# Patient Record
Sex: Male | Born: 1960
Health system: Southern US, Community
[De-identification: ages and names within clinical notes are randomized; demographics above are authoritative.]

## PROBLEM LIST (undated history)

## (undated) DIAGNOSIS — Z789 Other specified health status: Secondary | ICD-10-CM

## (undated) HISTORY — PX: WISDOM TOOTH EXTRACTION: SHX21

---

## 2001-01-11 ENCOUNTER — Ambulatory Visit (HOSPITAL_COMMUNITY): Admission: RE | Admit: 2001-01-11 | Discharge: 2001-01-11 | Payer: Self-pay | Admitting: Urology

## 2001-01-11 ENCOUNTER — Encounter: Payer: Self-pay | Admitting: Urology

## 2011-08-21 ENCOUNTER — Other Ambulatory Visit (HOSPITAL_COMMUNITY): Payer: Self-pay | Admitting: Internal Medicine

## 2011-08-21 ENCOUNTER — Ambulatory Visit (HOSPITAL_COMMUNITY)
Admission: RE | Admit: 2011-08-21 | Discharge: 2011-08-21 | Disposition: A | Discharge status: Home / Self Care | Payer: 59 | Source: Ambulatory Visit | Attending: Internal Medicine | Admitting: Internal Medicine

## 2011-08-21 DIAGNOSIS — M546 Pain in thoracic spine: Secondary | ICD-10-CM | POA: Insufficient documentation

## 2011-08-21 DIAGNOSIS — R05 Cough: Secondary | ICD-10-CM | POA: Insufficient documentation

## 2011-08-21 DIAGNOSIS — M549 Dorsalgia, unspecified: Secondary | ICD-10-CM

## 2011-08-21 DIAGNOSIS — R059 Cough, unspecified: Secondary | ICD-10-CM | POA: Insufficient documentation

## 2012-02-12 IMAGING — CR DG CHEST 2V
3 series · 3 of 3 positions shown · non-contrast
Comparison: None

CLINICAL DATA: Cough, mid back pain, radiculopathy radiating around
chest

CHEST - 2 VIEW

[view not recorded (1 of 3)]
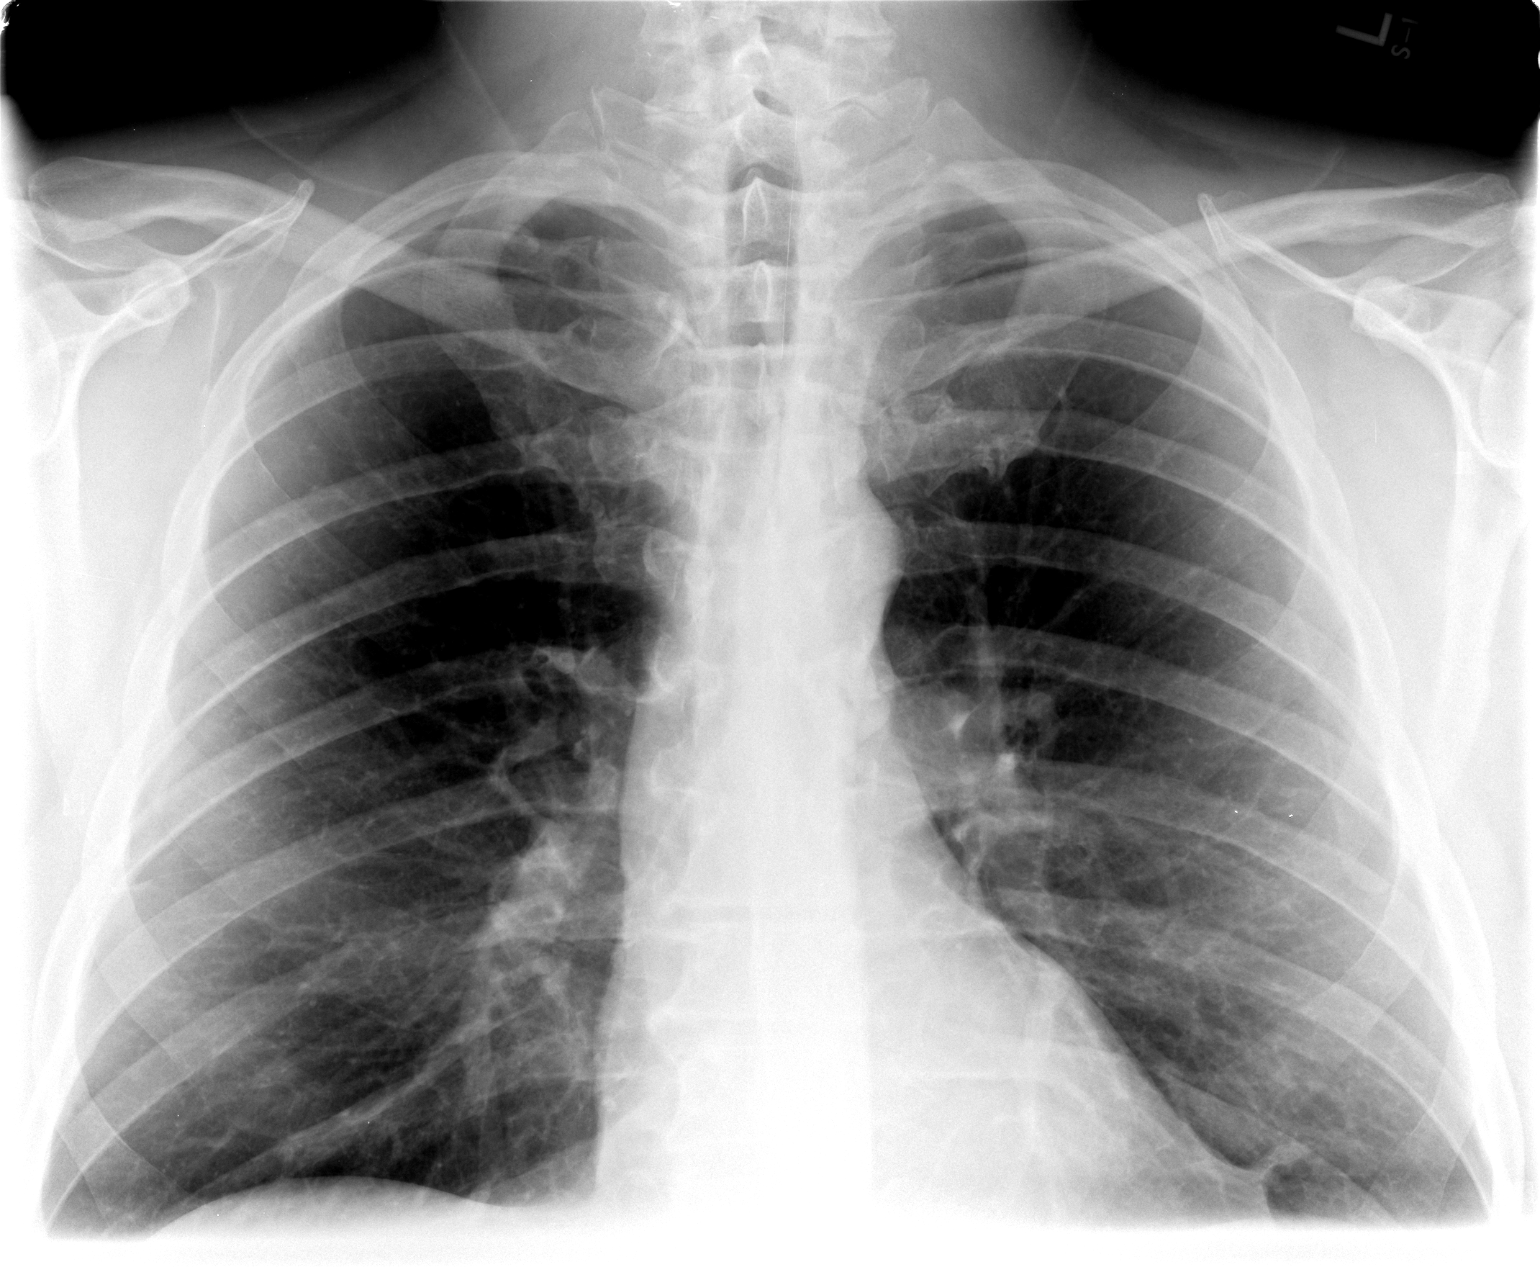

[view not recorded (2 of 3)]
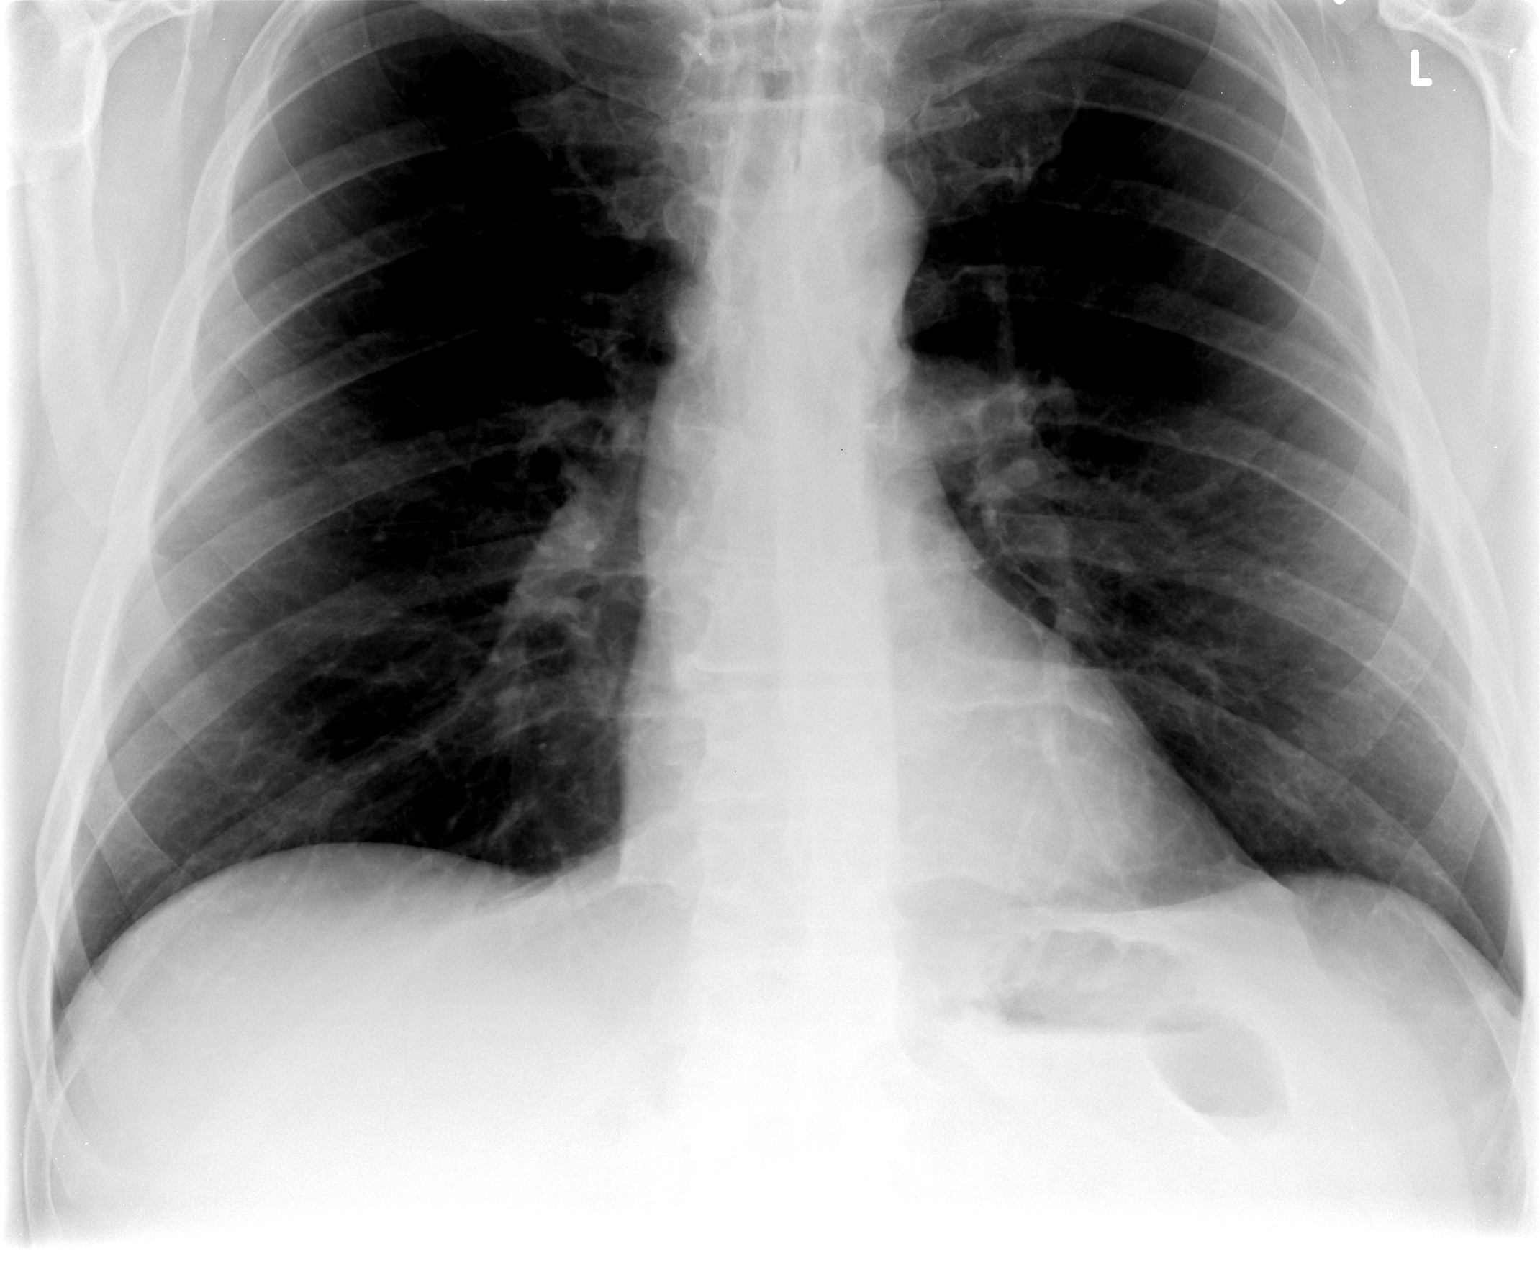

[view not recorded (3 of 3)]
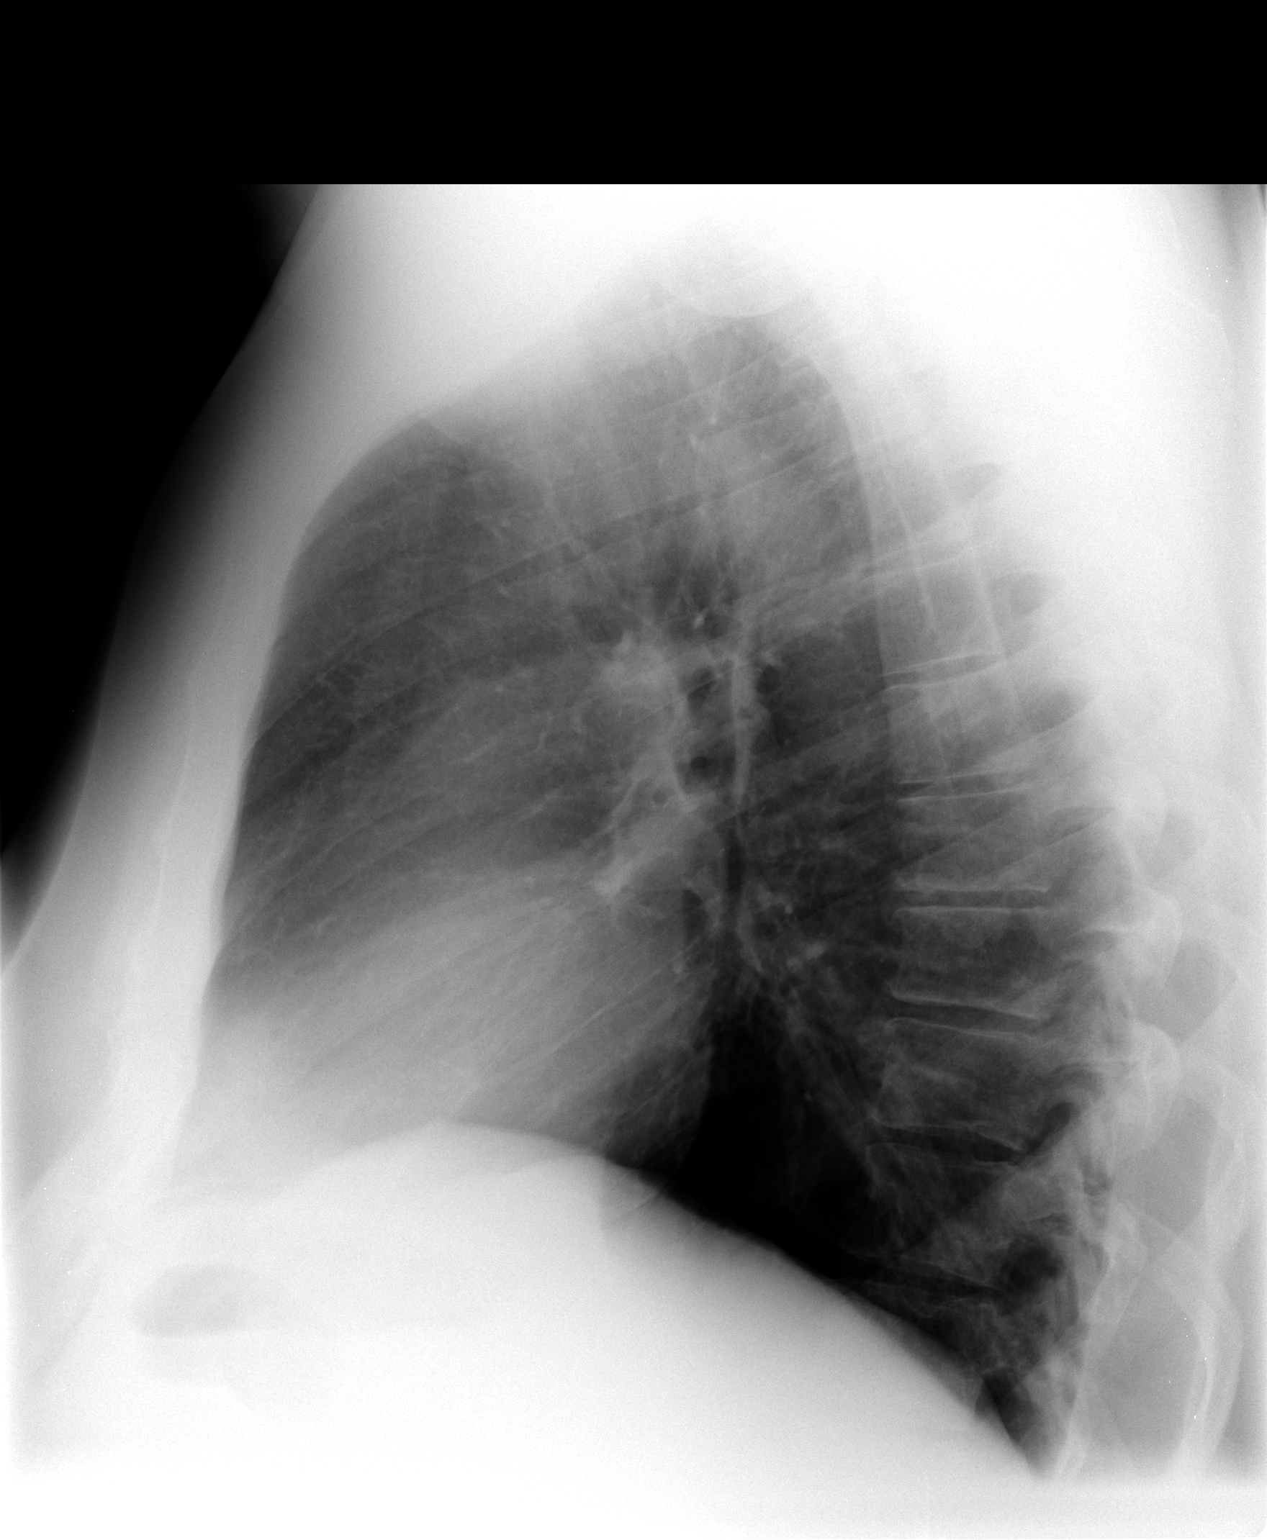

[3 of 3 positions shown; findings below may reference images not displayed]

FINDINGS: Normal heart size, mediastinal contours, and pulmonary vascularity.
Lungs clear.
No pleural effusion or pneumothorax.
Bones unremarkable.
IMPRESSION: No acute abnormalities.

## 2017-05-27 DIAGNOSIS — L237 Allergic contact dermatitis due to plants, except food: Secondary | ICD-10-CM | POA: Diagnosis not present

## 2017-05-27 DIAGNOSIS — Z6835 Body mass index (BMI) 35.0-35.9, adult: Secondary | ICD-10-CM | POA: Diagnosis not present

## 2017-06-11 DIAGNOSIS — Z125 Encounter for screening for malignant neoplasm of prostate: Secondary | ICD-10-CM | POA: Diagnosis not present

## 2017-06-11 DIAGNOSIS — I10 Essential (primary) hypertension: Secondary | ICD-10-CM | POA: Diagnosis not present

## 2017-06-15 DIAGNOSIS — R030 Elevated blood-pressure reading, without diagnosis of hypertension: Secondary | ICD-10-CM | POA: Diagnosis not present

## 2017-06-15 DIAGNOSIS — E782 Mixed hyperlipidemia: Secondary | ICD-10-CM | POA: Diagnosis not present

## 2017-06-15 DIAGNOSIS — Z0000 Encounter for general adult medical examination without abnormal findings: Secondary | ICD-10-CM | POA: Diagnosis not present

## 2017-06-15 DIAGNOSIS — Z6835 Body mass index (BMI) 35.0-35.9, adult: Secondary | ICD-10-CM | POA: Diagnosis not present

## 2017-06-23 ENCOUNTER — Encounter (INDEPENDENT_AMBULATORY_CARE_PROVIDER_SITE_OTHER): Payer: Self-pay | Admitting: *Deleted

## 2017-07-07 DIAGNOSIS — Z62820 Parent-biological child conflict: Secondary | ICD-10-CM | POA: Diagnosis not present

## 2017-07-09 ENCOUNTER — Other Ambulatory Visit (INDEPENDENT_AMBULATORY_CARE_PROVIDER_SITE_OTHER): Payer: Self-pay | Admitting: *Deleted

## 2017-07-09 ENCOUNTER — Encounter (INDEPENDENT_AMBULATORY_CARE_PROVIDER_SITE_OTHER): Payer: Self-pay | Admitting: *Deleted

## 2017-07-09 DIAGNOSIS — Z1211 Encounter for screening for malignant neoplasm of colon: Secondary | ICD-10-CM

## 2017-08-03 DIAGNOSIS — Z62820 Parent-biological child conflict: Secondary | ICD-10-CM | POA: Diagnosis not present

## 2017-09-14 DIAGNOSIS — Z62820 Parent-biological child conflict: Secondary | ICD-10-CM | POA: Diagnosis not present

## 2017-09-29 ENCOUNTER — Encounter (INDEPENDENT_AMBULATORY_CARE_PROVIDER_SITE_OTHER): Payer: Self-pay | Admitting: *Deleted

## 2017-09-29 ENCOUNTER — Telehealth (INDEPENDENT_AMBULATORY_CARE_PROVIDER_SITE_OTHER): Payer: Self-pay | Admitting: *Deleted

## 2017-09-29 NOTE — Telephone Encounter (Signed)
Patient needs trilyte 

## 2017-09-30 MED ORDER — PEG 3350-KCL-NA BICARB-NACL 420 G PO SOLR: 4000.0000 mL | Freq: Once | ORAL | 0 refills | Status: AC

## 2017-10-20 DIAGNOSIS — Z62820 Parent-biological child conflict: Secondary | ICD-10-CM | POA: Diagnosis not present

## 2017-10-23 ENCOUNTER — Telehealth (INDEPENDENT_AMBULATORY_CARE_PROVIDER_SITE_OTHER): Payer: Self-pay | Admitting: *Deleted

## 2017-10-23 NOTE — Telephone Encounter (Signed)
Referring MD/PCP: hall   Procedure: tcs  Reason/Indication:  screening  Has patient had this procedure before?  no  If so, when, by whom and where?    Is there a family history of colon cancer?  no  Who?  What age when diagnosed?    Is patient diabetic?   no      Does patient have prosthetic heart valve or mechanical valve?  no  Do you have a pacemaker?  no  Has patient ever had endocarditis? no  Has patient had joint replacement within last 12 months?  no  Is patient constipated or take laxatives? no  Does patient have a history of alcohol/drug use?  no  Is patient on Coumadin, Plavix and/or Aspirin? no  Medications: none  Allergies: nkda  Medication Adjustment per Dr Karilyn Cotaehman:   Procedure date & time: 11/18/17 at 830

## 2017-10-27 NOTE — Telephone Encounter (Signed)
agree

## 2017-11-03 DIAGNOSIS — Z62820 Parent-biological child conflict: Secondary | ICD-10-CM | POA: Diagnosis not present

## 2017-11-05 MED FILL — PEG-3350 SOLUTION: 420 | 1 days supply | Qty: 4000 | Fill #0

## 2017-11-18 ENCOUNTER — Encounter (HOSPITAL_COMMUNITY): Payer: Self-pay | Admitting: *Deleted

## 2017-11-18 ENCOUNTER — Other Ambulatory Visit: Payer: Self-pay

## 2017-11-18 ENCOUNTER — Encounter (HOSPITAL_COMMUNITY)
Admission: RE | Disposition: A | Discharge status: Home Health | Payer: Self-pay | Source: Ambulatory Visit | Attending: Internal Medicine

## 2017-11-18 ENCOUNTER — Ambulatory Visit (HOSPITAL_COMMUNITY)
Admission: RE | Admit: 2017-11-18 | Discharge: 2017-11-18 | Disposition: A | Discharge status: Home Health | Payer: 59 | Source: Ambulatory Visit | Attending: Internal Medicine | Admitting: Internal Medicine

## 2017-11-18 DIAGNOSIS — K644 Residual hemorrhoidal skin tags: Secondary | ICD-10-CM | POA: Diagnosis not present

## 2017-11-18 DIAGNOSIS — K5730 Diverticulosis of large intestine without perforation or abscess without bleeding: Secondary | ICD-10-CM | POA: Insufficient documentation

## 2017-11-18 DIAGNOSIS — Z79899 Other long term (current) drug therapy: Secondary | ICD-10-CM | POA: Diagnosis not present

## 2017-11-18 DIAGNOSIS — K648 Other hemorrhoids: Secondary | ICD-10-CM | POA: Diagnosis not present

## 2017-11-18 DIAGNOSIS — Z1211 Encounter for screening for malignant neoplasm of colon: Secondary | ICD-10-CM | POA: Diagnosis not present

## 2017-11-18 HISTORY — PX: COLONOSCOPY: SHX5424

## 2017-11-18 HISTORY — DX: Other specified health status: Z78.9

## 2017-11-18 SURGERY — COLONOSCOPY
Anesthesia: Moderate Sedation

## 2017-11-18 MED ORDER — MEPERIDINE HCL 50 MG/ML IJ SOLN
INTRAMUSCULAR | Status: DC | PRN
  Administered 2017-11-18 (×2): 25 mg via INTRAVENOUS

## 2017-11-18 MED ORDER — STERILE WATER FOR IRRIGATION IR SOLN
Status: DC | PRN
  Administered 2017-11-18: 08:00:00

## 2017-11-18 MED ORDER — MIDAZOLAM HCL 5 MG/5ML IJ SOLN
INTRAMUSCULAR | Status: DC | PRN
Start: 2017-11-18 — End: 2017-11-18
  Administered 2017-11-18 (×4): 2 mg via INTRAVENOUS

## 2017-11-18 MED ORDER — SODIUM CHLORIDE 0.9 % IV SOLN
INTRAVENOUS | Status: DC
  Administered 2017-11-18: 08:00:00 via INTRAVENOUS

## 2017-11-18 MED ORDER — MEPERIDINE HCL 50 MG/ML IJ SOLN
INTRAMUSCULAR | Status: AC
  Filled 2017-11-18: qty 1

## 2017-11-18 MED ORDER — MIDAZOLAM HCL 5 MG/5ML IJ SOLN
INTRAMUSCULAR | Status: AC
  Filled 2017-11-18: qty 10

## 2017-11-18 NOTE — H&P (Signed)
Justin Willis is an 56 y.o. male.   Chief Complaint: Patient is here for colonoscopy. HPI: Patient is 56 year old Caucasian male who is here for screening colonoscopy.  He denies abdominal pain change in bowel habits or rectal bleeding. Family history is negative for CRC.  Past Medical History:  Diagnosis Date  . Medical history non-contributory     Past Surgical History:  Procedure Laterality Date  . WISDOM TOOTH EXTRACTION      Family History  Problem Relation Age of Onset  . Colon cancer Neg Hx    Social History:  reports that  has never smoked. he has never used smokeless tobacco. He reports that he drinks about 7.2 oz of alcohol per week. He reports that he does not use drugs.  Allergies: No Known Allergies  Medications Prior to Admission  Medication Sig Dispense Refill  . ibuprofen (ADVIL,MOTRIN) 200 MG tablet Take 600 mg every 8 (eight) hours as needed by mouth (for pain).    . naproxen sodium (ALEVE) 220 MG tablet Take 440 mg 2 (two) times daily as needed by mouth (for pain).    . polyethylene glycol-electrolytes (NULYTELY/GOLYTELY) 420 g solution Take 4,000 mLs once by mouth.  0    No results found for this or any previous visit (from the past 48 hour(s)). No results found.  ROS  Blood pressure (!) 155/100, pulse 73, temperature 99.2 F (37.3 C), temperature source Oral, resp. rate 15, height 6\' 3"  (1.905 m), weight 280 lb (127 kg), SpO2 96 %. Physical Exam  Constitutional: He appears well-developed and well-nourished.  HENT:  Mouth/Throat: Oropharynx is clear and moist.  Eyes: Conjunctivae are normal. No scleral icterus.  Neck: No thyromegaly present.  Cardiovascular: Normal rate, regular rhythm and normal heart sounds.  No murmur heard. Respiratory: Effort normal and breath sounds normal.  GI:  Abdomen is full.  It is soft nontender without organomegaly or masses.  Musculoskeletal: He exhibits no edema.  Lymphadenopathy:    He has no cervical adenopathy.   Neurological: He is alert.  Skin: Skin is warm and dry.     Assessment/Plan Average risk screening colonoscopy.  Lionel DecemberNajeeb Tocara Mennen, MD 11/18/2017, 8:19 AM

## 2017-11-18 NOTE — Op Note (Signed)
[facility] Patient Name: Justin Willis Procedure Date: 11/18/2017 8:11 AM MRN: [id number] Date of Birth: 05/23/1961 Attending MD: Lionel December , MD CSN: [id number] Age: 56 Admit Type: Outpatient Procedure:                Colonoscopy Indications:              Screening for colorectal malignant neoplasm Providers:                Lionel December, MD, Loma Messing B. Patsy Lager, RN, Carlean Purl RN, RN Referring MD:             Catalina Pizza, MD Medicines:                Meperidine 50 mg IV, Midazolam 8 mg IV Complications:            No immediate complications. Estimated Blood Loss:     Estimated blood loss: none. Procedure:                Pre-Anesthesia Assessment:                           - Prior to the procedure, a History and Physical                            was performed, and patient medications and                            allergies were reviewed. The patient's tolerance of                            previous anesthesia was also reviewed. The risks                            and benefits of the procedure and the sedation                            options and risks were discussed with the patient.                            All questions were answered, and informed consent                            was obtained. Prior Anticoagulants: The patient                            last took naproxen 3 days prior to the procedure.                            ASA Grade Assessment: I - A normal, healthy                            patient. After reviewing the risks and benefits,  the patient was deemed in satisfactory condition to                            undergo the procedure.                           After obtaining informed consent, the colonoscope                            was passed under direct vision. Throughout the                            procedure, the patient's blood pressure, pulse, and                            oxygen  saturations were monitored continuously. The                            [id number] ([id number]) scope was introduced through                            the anus and advanced to the the cecum, identified                            by appendiceal orifice and ileocecal valve. The                            colonoscopy was performed without difficulty. The                            patient tolerated the procedure well. The quality                            of the bowel preparation was excellent. The                            ileocecal valve, appendiceal orifice, and rectum                            were photographed. Scope In: 8:28:24 AM Scope Out: 8:51:13 AM Scope Withdrawal Time: 0 hours 8 minutes 22 seconds  Total Procedure Duration: 0 hours 22 minutes 49 seconds  Findings:      The perianal and digital rectal examinations were normal.      Scattered medium-mouthed diverticula were found in the sigmoid colon.      The exam was otherwise normal throughout the examined colon.      External and internal hemorrhoids were found during retroflexion. The       hemorrhoids were small. Impression:               - Diverticulosis in the sigmoid colon.                           - External and internal hemorrhoids.                           -  No specimens collected. Moderate Sedation:      Moderate (conscious) sedation was administered by the endoscopy nurse       and supervised by the endoscopist. The following parameters were       monitored: oxygen saturation, heart rate, blood pressure, CO2       capnography and response to care. Total physician intraservice time was       29 minutes. Recommendation:           - Patient has a contact number available for                            emergencies. The signs and symptoms of potential                            delayed complications were discussed with the                            patient. Return to normal activities tomorrow.                             Written discharge instructions were provided to the                            patient.                           - High fiber diet today.                           - Continue present medications.                           - Repeat colonoscopy in 10 years for screening                            purposes. Procedure Code(s):        --- Professional ---                           [phone], Colonoscopy, flexible; diagnostic, including                            collection of specimen(s) by brushing or washing,                            when performed (separate procedure)                           99152, Moderate sedation services provided by the                            same physician or other qualified health care                            professional performing the diagnostic or  therapeutic service that the sedation supports,                            requiring the presence of an independent trained                            observer to assist in the monitoring of the                            patient's level of consciousness and physiological                            status; initial 15 minutes of intraservice time,                            patient age 42 years or older                           [phone], Moderate sedation services; each additional                            15 minutes intraservice time Diagnosis Code(s):        --- Professional ---                           Z12.11, Encounter for screening for malignant                            neoplasm of colon                           K64.8, Other hemorrhoids                           K57.30, Diverticulosis of large intestine without                            perforation or abscess without bleeding CPT copyright 2016 American [employer] Association. All rights reserved. The codes documented in this report are preliminary and upon coder review may  be revised to meet current compliance  requirements. Lionel DecemberNajeeb Rehman, MD Lionel DecemberNajeeb Rehman, MD 11/18/2017 8:58:35 AM This report has been signed electronically. Number of Addenda: 0

## 2017-11-18 NOTE — Discharge Instructions (Signed)
Resume usual medications as before. High fiber diet. No driving for 24 hours. Next screening exam in 10 years.   Colonoscopy, Adult, Care After This sheet gives you information about how to care for yourself after your procedure. Your health care provider may also give you more specific instructions. If you have problems or questions, contact your health care provider. What can I expect after the procedure? After the procedure, it is common to have:  A small amount of blood in your stool for 24 hours after the procedure.  Some gas.  Mild abdominal cramping or bloating.  Follow these instructions at home: General instructions   For the first 24 hours after the procedure: ? Do not drive or use machinery. ? Do not sign important documents. ? Do not drink alcohol. ? Do your regular daily activities at a slower pace than normal. ? Eat soft, easy-to-digest foods. ? Rest often.  Take over-the-counter or prescription medicines only as told by your health care provider.  It is up to you to get the results of your procedure. Ask your health care provider, or the department performing the procedure, when your results will be ready. Relieving cramping and bloating  Try walking around when you have cramps or feel bloated.  Apply heat to your abdomen as told by your health care provider. Use a heat source that your health care provider recommends, such as a moist heat pack or a heating pad. ? Place a towel between your skin and the heat source. ? Leave the heat on for 20-30 minutes. ? Remove the heat if your skin turns bright red. This is especially important if you are unable to feel pain, heat, or cold. You may have a greater risk of getting burned. Eating and drinking  Drink enough fluid to keep your urine clear or pale yellow.  Resume your normal diet as instructed by your health care provider. Avoid heavy or fried foods that are hard to digest.  Avoid drinking alcohol for as long  as instructed by your health care provider. Contact a health care provider if:  You have blood in your stool 2-3 days after the procedure. Get help right away if:  You have more than a small spotting of blood in your stool.  You pass large blood clots in your stool.  Your abdomen is swollen.  You have nausea or vomiting.  You have a fever.  You have increasing abdominal pain that is not relieved with medicine. This information is not intended to replace advice given to you by your health care provider. Make sure you discuss any questions you have with your health care provider.   Diverticulosis Diverticulosis is a condition that develops when small pouches (diverticula) form in the wall of the large intestine (colon). The colon is where water is absorbed and stool is formed. The pouches form when the inside layer of the colon pushes through weak spots in the outer layers of the colon. You may have a few pouches or many of them. What are the causes? The cause of this condition is not known. What increases the risk? The following factors may make you more likely to develop this condition:  Being older than age 54. 36Your risk for this condition increases with age. Diverticulosis is rare among people younger than age 54. By age 54, many people have it.  Eating a low-fiber diet.  Having frequent constipation.  Being overweight.  Not getting enough exercise.  Smoking.  Taking over-the-counter pain medicines,  like aspirin and ibuprofen.  Having a family history of diverticulosis.  What are the signs or symptoms? In most people, there are no symptoms of this condition. If you do have symptoms, they may include:  Bloating.  Cramps in the abdomen.  Constipation or diarrhea.  Pain in the lower left side of the abdomen.  How is this diagnosed? This condition is most often diagnosed during an exam for other colon problems. Because diverticulosis usually has no symptoms, it  often cannot be diagnosed independently. This condition may be diagnosed by:  Using a flexible scope to examine the colon (colonoscopy).  Taking an X-ray of the colon after dye has been put into the colon (barium enema).  Doing a CT scan.  How is this treated? You may not need treatment for this condition if you have never developed an infection related to diverticulosis. If you have had an infection before, treatment may include:  Eating a high-fiber diet. This may include eating more fruits, vegetables, and grains.  Taking a fiber supplement.  Taking a live bacteria supplement (probiotic).  Taking medicine to relax your colon.  Taking antibiotic medicines.  Follow these instructions at home:  Drink 6-8 glasses of water or more each [place] to prevent constipation.  Try not to strain when you have a bowel movement.  If you have had an infection before: ? Eat more fiber as directed by your health care provider or your diet and nutrition specialist (dietitian). ? Take a fiber supplement or probiotic, if your health care provider approves.  Take over-the-counter and prescription medicines only as told by your health care provider.  If you were prescribed an antibiotic, take it as told by your health care provider. Do not stop taking the antibiotic even if you start to feel better.  Keep all follow-up visits as told by your health care provider. This is important. Contact a health care provider if:  You have pain in your abdomen.  You have bloating.  You have cramps.  You have not had a bowel movement in 3 days. Get help right away if:  Your pain gets worse.  Your bloating becomes very bad.  You have a fever or chills, and your symptoms suddenly get worse.  You vomit.  You have bowel movements that are bloody or black.  You have bleeding from your rectum. Summary  Diverticulosis is a condition that develops when small pouches (diverticula) form in the wall of the  large intestine (colon).  You may have a few pouches or many of them.  This condition is most often diagnosed during an exam for other colon problems.  If you have had an infection related to diverticulosis, treatment may include increasing the fiber in your diet, taking supplements, or taking medicines. This information is not intended to replace advice given to you by your health care provider. Make sure you discuss any questions you have with your health care provider.   High-Fiber Diet Fiber, also called dietary fiber, is a type of carbohydrate found in fruits, vegetables, whole grains, and beans. A high-fiber diet can have many health benefits. Your health care provider may recommend a high-fiber diet to help:  Prevent constipation. Fiber can make your bowel movements more regular.  Lower your cholesterol.  Relieve hemorrhoids, uncomplicated diverticulosis, or irritable bowel syndrome.  Prevent overeating as part of a weight-loss plan.  Prevent heart disease, type 2 diabetes, and certain cancers.  What is my plan? The recommended daily intake of fiber includes:  38 grams for men under age 550.  30 grams for men over age 54.  25 grams for women under age 54.  21 grams for women over age 54.  You can get the recommended daily intake of dietary fiber by eating a variety of fruits, vegetables, grains, and beans. Your health care provider may also recommend a fiber supplement if it is not possible to get enough fiber through your diet. What do I need to know about a high-fiber diet?  Fiber supplements have not been widely studied for their effectiveness, so it is better to get fiber through food sources.  Always check the fiber content on thenutrition facts label of any prepackaged food. Look for foods that contain at least 5 grams of fiber per serving.  Ask your dietitian if you have questions about specific foods that are related to your condition, especially if those foods  are not listed in the following section.  Increase your daily fiber consumption gradually. Increasing your intake of dietary fiber too quickly may cause bloating, cramping, or gas.  Drink plenty of water. Water helps you to digest fiber. What foods can I eat? Grains Whole-grain breads. Multigrain cereal. Oats and oatmeal. Brown rice. Barley. Bulgur wheat. Millet. Bran muffins. Popcorn. Rye wafer crackers. Vegetables Sweet potatoes. Spinach. Kale. Artichokes. Cabbage. Broccoli. Green peas. Carrots. Squash. Fruits Berries. Pears. Apples. Oranges. Avocados. Prunes and raisins. Dried figs. Meats and Other Protein Sources Navy, kidney, pinto, and soy beans. Split peas. Lentils. Nuts and seeds. Dairy Fiber-fortified yogurt. Beverages Fiber-fortified soy milk. Fiber-fortified orange juice. Other Fiber bars. The items listed above may not be a complete list of recommended foods or beverages. Contact your dietitian for more options. What foods are not recommended? Grains White bread. Pasta made with refined flour. White rice. Vegetables Fried potatoes. Canned vegetables. Well-cooked vegetables. Fruits Fruit juice. Cooked, strained fruit. Meats and Other Protein Sources Fatty cuts of meat. Fried Environmental education officerpoultry or fried fish. Dairy Milk. Yogurt. Cream cheese. Sour cream. Beverages Soft drinks. Other Cakes and pastries. Butter and oils. The items listed above may not be a complete list of foods and beverages to avoid. Contact your dietitian for more information. What are some tips for including high-fiber foods in my diet?  Eat a wide variety of high-fiber foods.  Make sure that half of all grains consumed each Bloodgood are whole grains.  Replace breads and cereals made from refined flour or white flour with whole-grain breads and cereals.  Replace white rice with brown rice, bulgur wheat, or millet.  Start the [place] with a breakfast that is high in fiber, such as a cereal that contains at least  5 grams of fiber per serving.  Use beans in place of meat in soups, salads, or pasta.  Eat high-fiber snacks, such as berries, raw vegetables, nuts, or popcorn. This information is not intended to replace advice given to you by your health care provider. Make sure you discuss any questions you have with your health care provider.

## 2017-11-23 ENCOUNTER — Encounter (HOSPITAL_COMMUNITY): Payer: Self-pay | Admitting: Internal Medicine

## 2017-11-26 DIAGNOSIS — H5213 Myopia, bilateral: Secondary | ICD-10-CM | POA: Diagnosis not present

## 2017-11-26 DIAGNOSIS — H524 Presbyopia: Secondary | ICD-10-CM | POA: Diagnosis not present

## 2017-11-26 DIAGNOSIS — H52221 Regular astigmatism, right eye: Secondary | ICD-10-CM | POA: Diagnosis not present
